# Patient Record
Sex: Female | Born: 1995 | Race: White | Hispanic: No | Marital: Single | Smoking: Never smoker
Health system: Southern US, Community
[De-identification: ages and names within clinical notes are randomized; demographics above are authoritative.]

---
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
## 2017-08-04 IMAGING — CR DG CHEST 2V
2 series · 2 of 2 positions shown · non-contrast
Comparison: None.

CLINICAL DATA: 20-year-old with acute onset of left-sided chest
pain and left facial numbness at 6 o'clock p.m. this evening.

EXAM:
CHEST  2 VIEW

[chest pa]
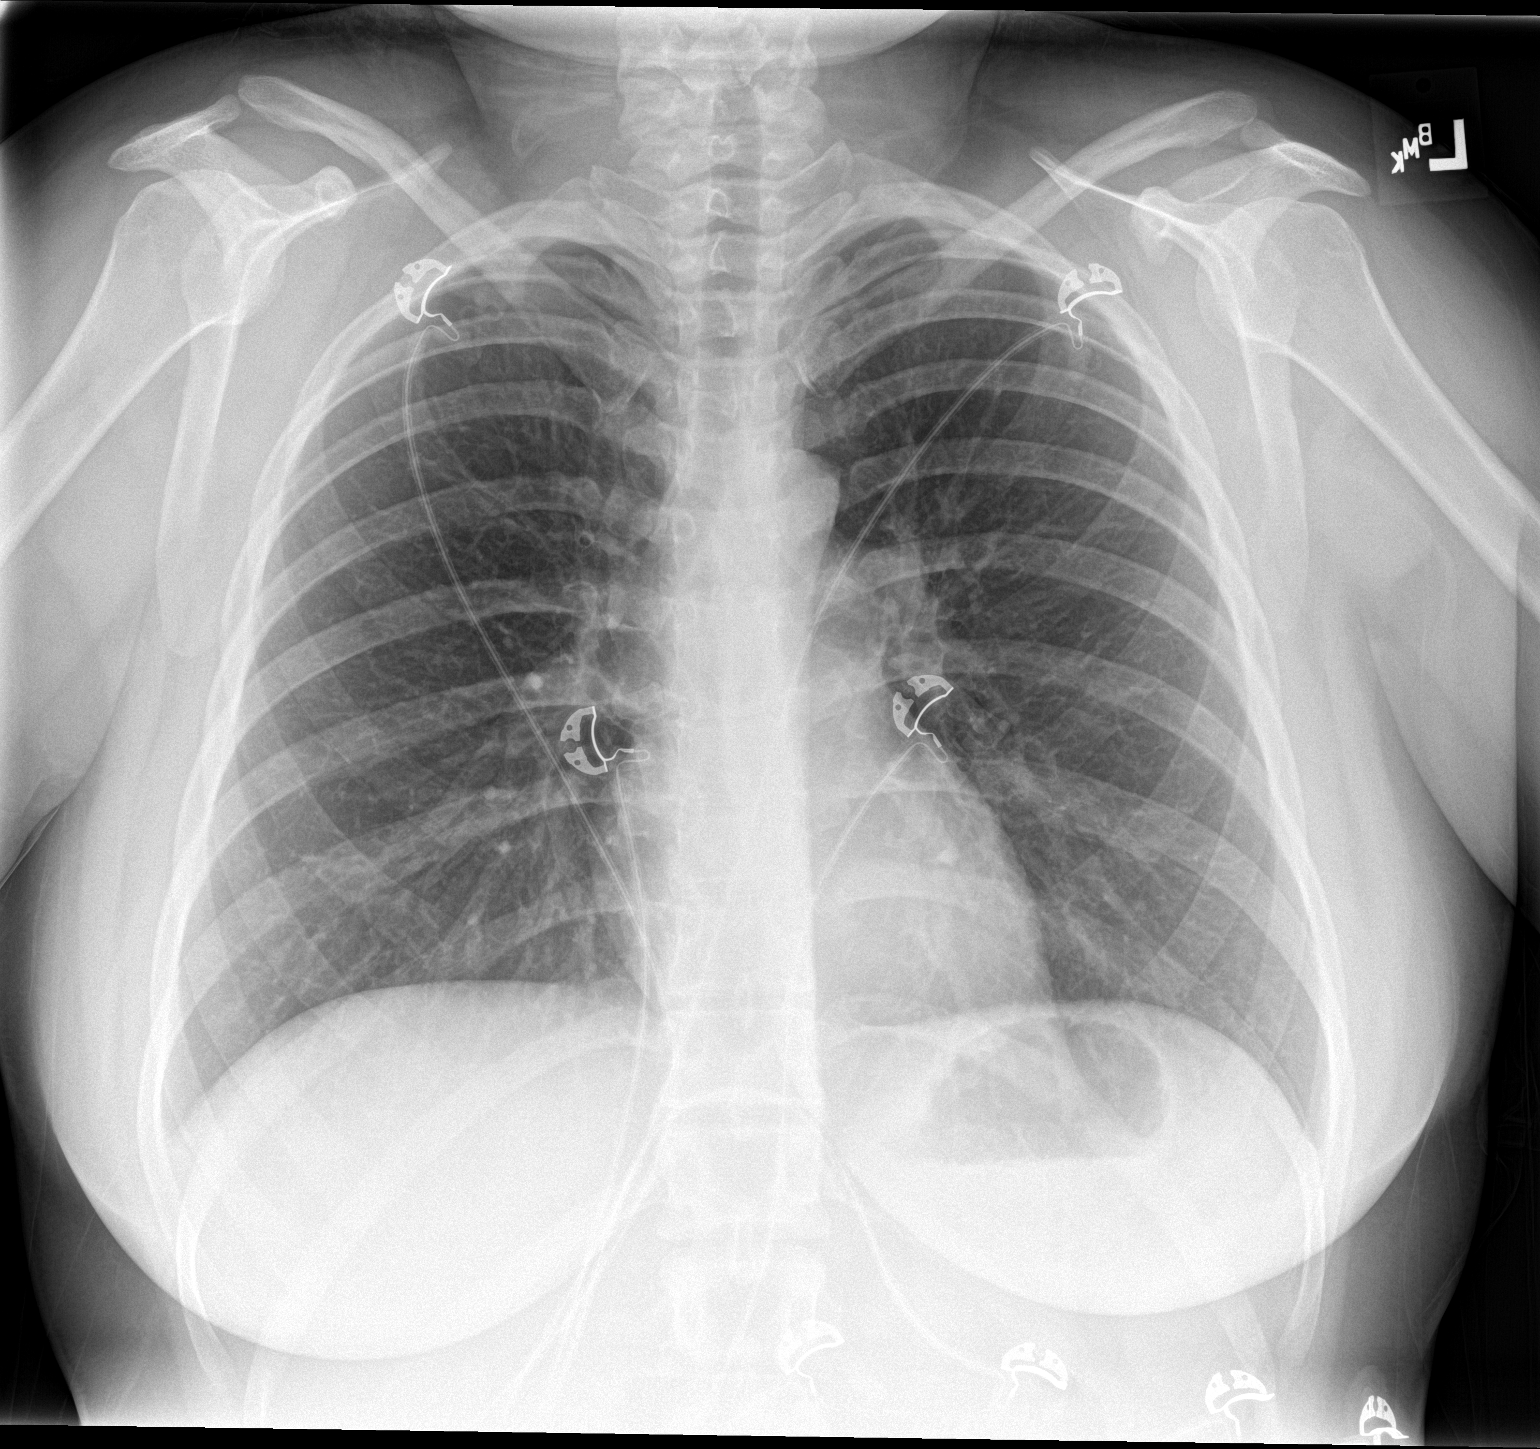

[chest lat]
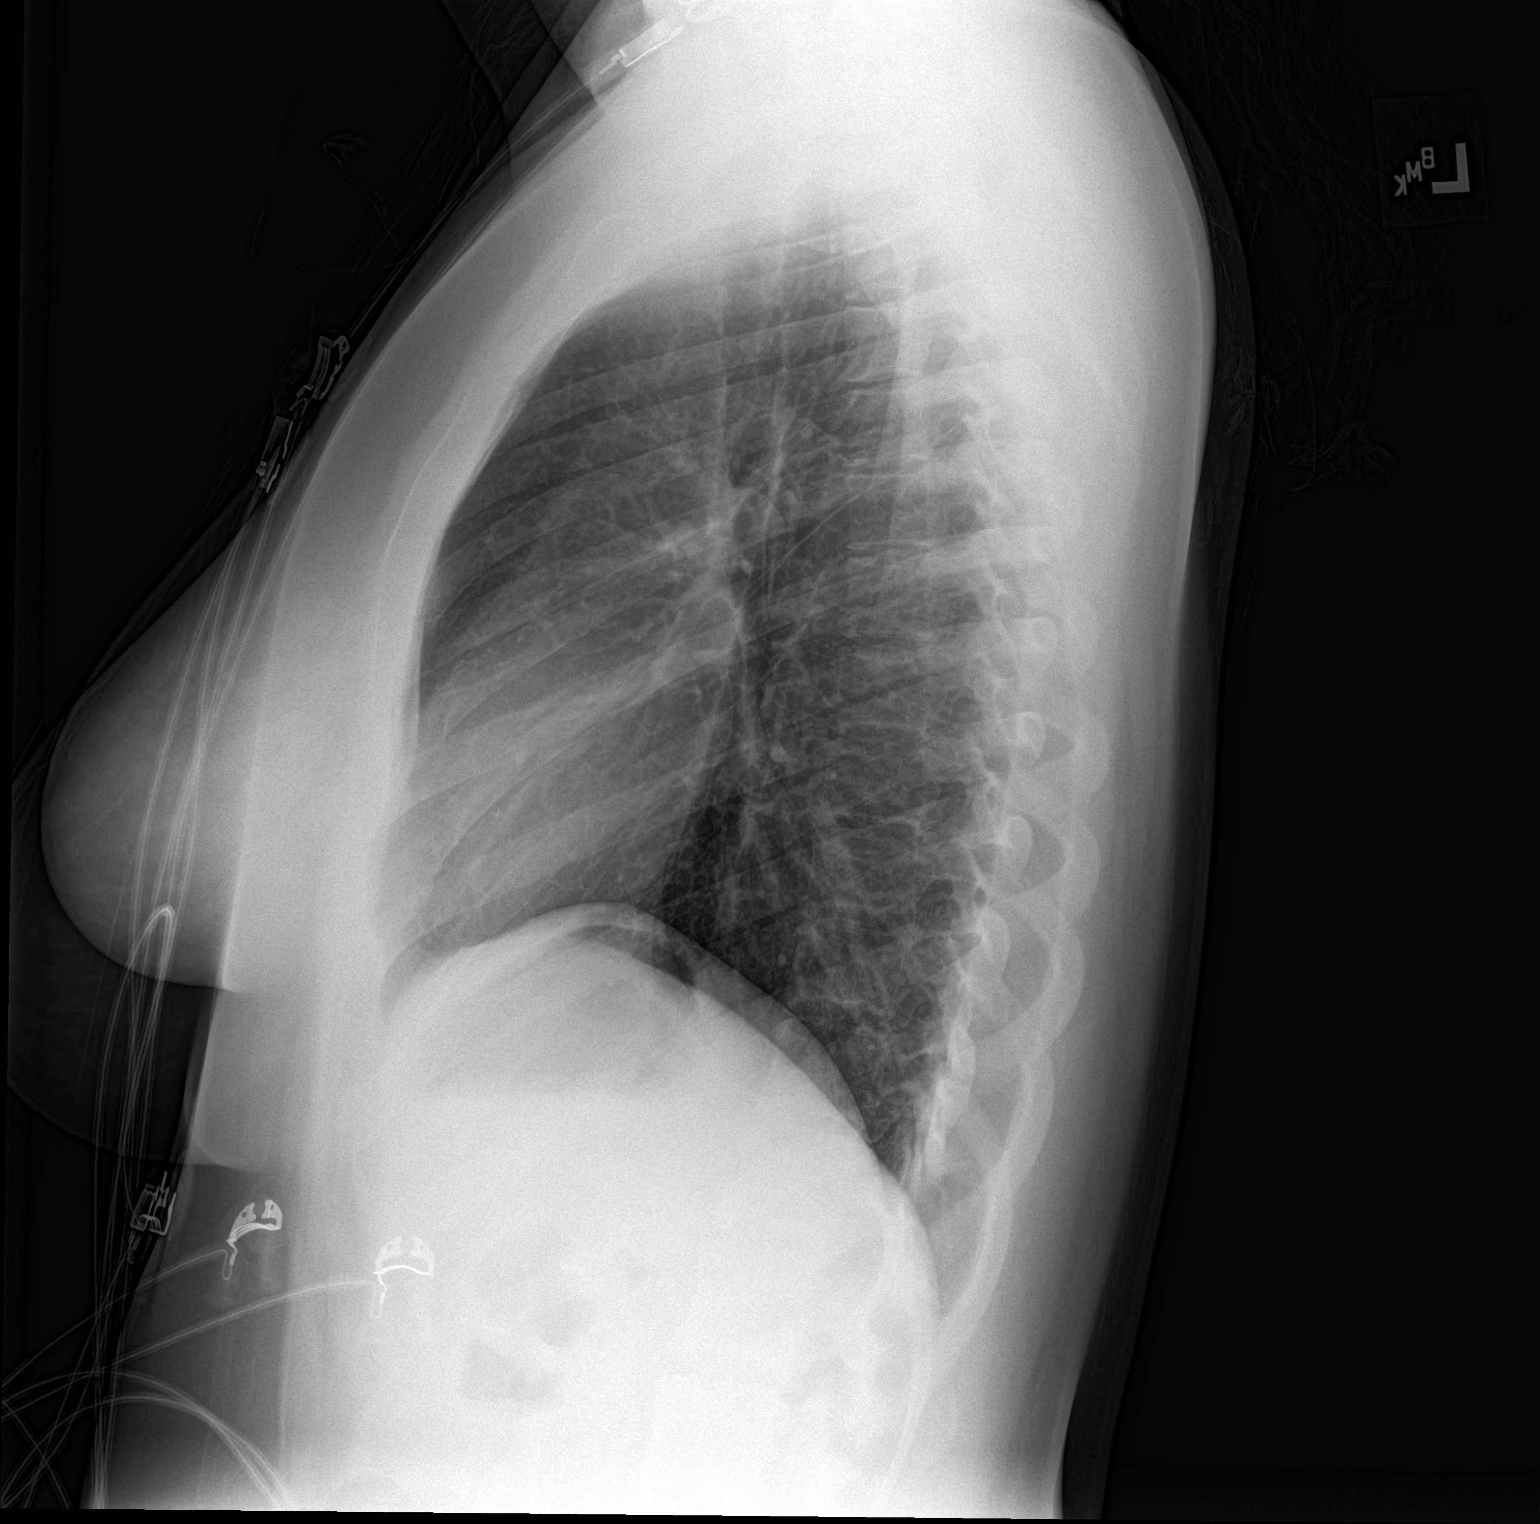

[2 of 2 positions shown; findings below may reference images not displayed]

FINDINGS: Cardiomediastinal silhouette unremarkable. Lungs clear.
Bronchovascular markings normal. Pulmonary vascularity normal. No
pneumothorax. No pleural effusions. Visualized bony thorax intact.
IMPRESSION: Normal examination.
